# Patient Record
Sex: Female | Born: 1980 | Race: Black or African American | Hispanic: No | Marital: Single | Smoking: Never smoker
Health system: Southern US, Community
[De-identification: ages and names within clinical notes are randomized; demographics above are authoritative.]

## PROBLEM LIST (undated history)

## (undated) DIAGNOSIS — D649 Anemia, unspecified: Secondary | ICD-10-CM

## (undated) DIAGNOSIS — E0590 Thyrotoxicosis, unspecified without thyrotoxic crisis or storm: Secondary | ICD-10-CM

## (undated) DIAGNOSIS — E0500 Thyrotoxicosis with diffuse goiter without thyrotoxic crisis or storm: Secondary | ICD-10-CM

---

## 2004-01-05 ENCOUNTER — Emergency Department (HOSPITAL_COMMUNITY): Admission: EM | Admit: 2004-01-05 | Discharge: 2004-01-05 | Payer: Self-pay | Admitting: Family Medicine

## 2006-01-09 ENCOUNTER — Emergency Department: Payer: Self-pay | Admitting: Emergency Medicine

## 2006-06-01 ENCOUNTER — Emergency Department: Payer: Self-pay | Admitting: Emergency Medicine

## 2017-10-10 ENCOUNTER — Other Ambulatory Visit: Payer: Self-pay

## 2017-10-10 ENCOUNTER — Encounter
Admission: RE | Admit: 2017-10-10 | Discharge: 2017-10-10 | Disposition: A | Discharge status: Home / Self Care | Payer: Medicaid Other | Source: Ambulatory Visit | Attending: Obstetrics and Gynecology | Admitting: Obstetrics and Gynecology

## 2017-10-10 DIAGNOSIS — Z0183 Encounter for blood typing: Secondary | ICD-10-CM | POA: Insufficient documentation

## 2017-10-10 DIAGNOSIS — Z01812 Encounter for preprocedural laboratory examination: Secondary | ICD-10-CM | POA: Insufficient documentation

## 2017-10-10 HISTORY — DX: Thyrotoxicosis, unspecified without thyrotoxic crisis or storm: E05.90

## 2017-10-10 HISTORY — DX: Thyrotoxicosis with diffuse goiter without thyrotoxic crisis or storm: E05.00

## 2017-10-10 HISTORY — DX: Anemia, unspecified: D64.9

## 2017-10-10 LAB — CBC
HEMATOCRIT: 31.2 % — AB (ref 35.0–47.0)
Hemoglobin: 9.4 g/dL — ABNORMAL LOW (ref 12.0–16.0)
MCH: 18.4 pg — ABNORMAL LOW (ref 26.0–34.0)
MCHC: 30.2 g/dL — ABNORMAL LOW (ref 32.0–36.0)
MCV: 60.9 fL — ABNORMAL LOW (ref 80.0–100.0)
Platelets: 453 10*3/uL — ABNORMAL HIGH (ref 150–440)
RBC: 5.12 MIL/uL (ref 3.80–5.20)
RDW: 20.4 % — ABNORMAL HIGH (ref 11.5–14.5)
WBC: 3.3 10*3/uL — AB (ref 3.6–11.0)

## 2017-10-10 LAB — BASIC METABOLIC PANEL
Anion gap: 8 (ref 5–15)
BUN: 13 mg/dL (ref 6–20)
CALCIUM: 9 mg/dL (ref 8.9–10.3)
CO2: 25 mmol/L (ref 22–32)
Chloride: 106 mmol/L (ref 101–111)
Creatinine, Ser: 0.45 mg/dL (ref 0.44–1.00)
GFR calc non Af Amer: >60 mL/min (ref >60)
Glucose, Bld: 118 mg/dL — ABNORMAL HIGH (ref 65–99)
Potassium: 3.6 mmol/L (ref 3.5–5.1)
Sodium: 139 mmol/L (ref 135–145)

## 2017-10-10 LAB — TYPE AND SCREEN
ABO/RH(D): O POS
Antibody Screen: NEGATIVE

## 2017-10-10 NOTE — Patient Instructions (Signed)
Your procedure is scheduled on: Fri 10/18/17 Report to [place]. To find out your arrival time please call [phone] between 1PM - 3PM on Thur 10/17/17.  Remember: Instructions that are not followed completely may result in serious medical risk, up to and including death, or upon the discretion of your surgeon and anesthesiologist your surgery may need to be rescheduled.     _X__ 1. Do not eat food after midnight the night before your procedure.                 No gum chewing or hard candies. You may drink clear liquids up to 2 hours                 before you are scheduled to arrive for your surgery- DO not drink clear                 liquids within 2 hours of the start of your surgery.                 Clear Liquids include:  water, apple juice without pulp, clear carbohydrate                 drink such as Clearfast or Gatorade, Black Coffee or Tea (Do not add                 anything to coffee or tea).  __X__2.  On the morning of surgery brush your teeth with toothpaste and water, you                 may rinse your mouth with mouthwash if you wish.  Do not swallow any              toothpaste of mouthwash.     _X__ 3.  No Alcohol for 24 hours before or after surgery.   _X__ 4.  Do Not Smoke or use e-cigarettes For 24 Hours Prior to Your Surgery.                 Do not use any chewable tobacco products for at least 6 hours prior to                 surgery.  ____  5.  Bring all medications with you on the day of surgery if instructed.   __X__  6.  Notify your doctor if there is any change in your medical condition      (cold, fever, infections).     Do not wear jewelry, make-up, hairpins, clips or nail polish. Do not wear lotions, powders, or perfumes.  Do not shave 48 hours prior to surgery. Men may shave face and neck. Do not bring valuables to the hospital.    [facility] is not responsible for any belongings or  valuables.  Contacts, dentures/partials or body piercings may not be worn into surgery. Bring a case for your contacts, glasses or hearing aids, a denture cup will be supplied. Leave your suitcase in the car. After surgery it may be brought to your room. For patients admitted to the hospital, discharge time is determined by your treatment team.   Patients discharged the day of surgery will not be allowed to drive home.   Please read over the following fact sheets that you were given:   MRSA Information  __X__ Take these medicines the morning of surgery with A SIP OF WATER:  1. METHIMAZOLE  2. ZYRTEC IF NEEDED  3.   4.  5.  6.  ____ Fleet Enema (as directed)   __X__ Use CHG Soap/SAGE wipes as directed  ____ Use inhalers on the day of surgery  ____ Stop metformin/Janumet/Farxiga 2 days prior to surgery    ____ Take 1/2 of usual insulin dose the night before surgery. No insulin the morning          of surgery.   ____ Stop Blood Thinners Coumadin/Plavix/Xarelto/Pleta/Pradaxa/Eliquis/Effient/Aspirin  on   Or contact your Surgeon, Cardiologist or Medical Doctor regarding  ability to stop your blood thinners  __X__ Stop Anti-inflammatories 7 days before surgery such as Advil, Ibuprofen, Motrin,  BC or Goodies Powder, Naprosyn, Naproxen, Aleve, Aspirin    __X__ Stopall herbal supplements, fish oil or vitamin E until after surgery.    ____ Bring C-Pap to the hospital.

## 2017-10-10 NOTE — H&P (Signed)
Chelsea Adams is a 37 y.o. female presenting with Pre Op Consulting on 10/01/2017  HPI: Heavy, regular periods, last 2 weeks, getting worse. Has tried OCPs without relief. Is significantly negatively impacting quality of life.  Ultrasound in 8/18: Impression: 1. 10.4 cm heterogeneous fibroid comprising the majority of the uterine body and fundus. The endometrial complex is not visualized due to distortion by the fibroid. 2. The left ovary is not visualized due to bowel gas.  She has a hx of C/S x2 Hx of hyperthyroidism on methimazole.  She is requesting definitive management.  Pap smear: 2/19 neg EMBx: Cervical stenosis and patient anxiety/discomfort precluded EMBx  Past Medical History: has a past medical history of Gestational diabetes, Graves disease, Hypertension, Pap smear for cervical cancer screening, Proptosis (01/26/2014), and Ptosis of right eyelid (01/26/2014).  Past Surgical History: has a past surgical history that includes Cesarean section (01/09/2007) and cesarean delivery (Bilateral, 10/03/2012). Family History: family history includes Asthma in her father; High blood pressure (Hypertension) in her father and mother. Social History: reports that she has never smoked. She has never used smokeless tobacco. She reports that she does not drink alcohol or use drugs. OB/GYN History:  OB History  Gravida  2  Para  2  Term  0  Preterm  2  AB   Living  4   SAB   TAB   Ectopic   Molar   Multiple  1  Live Births  4      Allergies: has No Known Allergies. Medications:  Current Outpatient Medications:  . ALPRAZolam (XANAX) 0.5 MG tablet, Take 2 tablets prior to procedure and one tab after prn, Disp: 3 tablet, Rfl: 0 . ipratropium (ATROVENT) 0.06 % nasal spray, Place 2 sprays into both nostrils 3 (three) times daily., Disp: 15 mL, Rfl: 2 . levocetirizine (XYZAL) 5 MG tablet, Take 1 tablet (5 mg total) by mouth every evening., Disp: 30 tablet,  Rfl: 2 . levonorgestrel-ethinyl estradiol (LEVONORGESTREL-ETHINYL ESTRADIOL) 0.1-20 mg-mcg tablet, Take 1 tablet by mouth once daily., Disp: 3 Package, Rfl: 3 . methIMAzole (TAPAZOLE) 10 MG tablet, Take 2 tablets (20 mg total) by mouth once daily, Disp: 60 tablet, Rfl: 5  Review of Systems: No SOB, no palpitations or chest pain, no new lower extremity edema, no nausea or vomiting or bowel or bladder complaints. See HPI for gyn specific ROS.  Exam:   BP 120/80  Pulse 74  Ht 165.1 cm (5\' 5" )  Wt 85.7 kg (189 lb)  BMI 31.45 kg/m   General: Patient is well-groomed, well-nourished, appears stated age in no acute distress  HEENT: head is atraumatic and normocephalic, trachea is midline, neck is supple with no palpable nodules  CV: Regular rhythm and normal heart rate, no murmur  Pulm: Clear to auscultation throughout lung fields with no wheezing, crackles, or rhonchi. No increased work of breathing  Abdomen: soft , no mass, non-tender, no rebound tenderness, no hepatomegaly  Pelvic:  External genitalia: vulva and labia without lesions, Tanner stage 5 Urethra: no prolapse Vagina: normal physiologic d/c Cervix: no lesions, no cervical motion tenderness  Uterus: normal size shape and contour, non-tender Adnexa: no mass, non-tender  Rectovaginal: external exam normal Cervical stenosis and patient anxiety/discomfort precluded EMBx  Impression:   The primary encounter diagnosis was Excessive or frequent menstruation. A diagnosis of Intramural, submucous, and subserous leiomyoma of uterus was also pertinent to this visit.  Plan:   Patient returns for a preoperative discussion regarding her plans to proceed with surgical treatment  of her by total laparoscopic hysterectomy with bilateral salpingectomy procedure. We will perform a cystoscopy to evaluate the urinary tract after the procedure. I have given her a 30% chance of needing a TAH with hx of C/S x2 and large fibroid.  Despite no  tissue with EMBx dx will proceed with surgery, as young woman unlikely bleeding due to hyperplasia with BMI of 31 and known large fibroids. She is aware that if we find malignancy on pathology, she may need a second surgery or other interventions.  I offered cervical block and po pain meds, as well as anxiolytics.  The patient and I discussed the technical aspects of the procedure including the potential for risks and complications. These include but are not limited to the risk of infection requiring post-operative antibiotics or further procedures. We talked about the risk of injury to adjacent organs including bladder, bowel, ureter, blood vessels or nerves. We talked about the need to convert to an open incision. We talked about the possible need for blood transfusion. We talked about postop complications such as thromboembolic or cardiopulmonary complications. All of her questions were answered. Her preoperative exam was completed and the appropriate consents were signed. She is scheduled to undergo this procedure in the near future.  Specific Peri-operative Considerations:  - Consent: obtained today - Health Maintenance: up to date - Labs: CBC, CMP preoperatively - Studies: EKG, CXR preoperatively - Bowel Preparation: None required - Abx: Cefoxitin 2g - VTE ppx: SCDs perioperatively - Glucose Protocol: n/a - Beta-blockade: n/a

## 2017-10-17 MED ORDER — CEFAZOLIN SODIUM-DEXTROSE 2-4 GM/100ML-% IV SOLN
2.0000 g | INTRAVENOUS | Status: AC
  Administered 2017-10-18: 2 g via INTRAVENOUS

## 2017-10-18 ENCOUNTER — Other Ambulatory Visit: Payer: Self-pay

## 2017-10-18 ENCOUNTER — Ambulatory Visit: Payer: Medicaid Other | Admitting: Anesthesiology

## 2017-10-18 ENCOUNTER — Encounter
Admission: RE | Disposition: A | Discharge status: Home / Self Care | Payer: Self-pay | Source: Ambulatory Visit | Attending: Obstetrics and Gynecology

## 2017-10-18 ENCOUNTER — Ambulatory Visit
Admission: RE | Admit: 2017-10-18 | Discharge: 2017-10-18 | Disposition: A | Discharge status: Home / Self Care | Payer: Medicaid Other | Source: Ambulatory Visit | Attending: Obstetrics and Gynecology | Admitting: Obstetrics and Gynecology

## 2017-10-18 DIAGNOSIS — K660 Peritoneal adhesions (postprocedural) (postinfection): Secondary | ICD-10-CM | POA: Diagnosis not present

## 2017-10-18 DIAGNOSIS — N888 Other specified noninflammatory disorders of cervix uteri: Secondary | ICD-10-CM | POA: Insufficient documentation

## 2017-10-18 DIAGNOSIS — D259 Leiomyoma of uterus, unspecified: Secondary | ICD-10-CM | POA: Diagnosis not present

## 2017-10-18 DIAGNOSIS — E0500 Thyrotoxicosis with diffuse goiter without thyrotoxic crisis or storm: Secondary | ICD-10-CM | POA: Insufficient documentation

## 2017-10-18 DIAGNOSIS — Z79899 Other long term (current) drug therapy: Secondary | ICD-10-CM | POA: Insufficient documentation

## 2017-10-18 DIAGNOSIS — I10 Essential (primary) hypertension: Secondary | ICD-10-CM | POA: Diagnosis not present

## 2017-10-18 DIAGNOSIS — N72 Inflammatory disease of cervix uteri: Secondary | ICD-10-CM | POA: Insufficient documentation

## 2017-10-18 DIAGNOSIS — N939 Abnormal uterine and vaginal bleeding, unspecified: Secondary | ICD-10-CM | POA: Diagnosis present

## 2017-10-18 HISTORY — PX: CYSTOSCOPY: SHX5120

## 2017-10-18 HISTORY — PX: LAPAROSCOPIC HYSTERECTOMY: SHX1926

## 2017-10-18 HISTORY — PX: LAPAROSCOPIC BILATERAL SALPINGECTOMY: SHX5889

## 2017-10-18 LAB — CBC
HEMATOCRIT: 26.1 % — AB (ref 35.0–47.0)
Hemoglobin: 7.9 g/dL — ABNORMAL LOW (ref 12.0–16.0)
MCH: 18.2 pg — ABNORMAL LOW (ref 26.0–34.0)
MCHC: 30.1 g/dL — ABNORMAL LOW (ref 32.0–36.0)
MCV: 60.5 fL — AB (ref 80.0–100.0)
Platelets: 410 10*3/uL (ref 150–440)
RBC: 4.31 MIL/uL (ref 3.80–5.20)
RDW: 19.9 % — ABNORMAL HIGH (ref 11.5–14.5)
WBC: 3.2 10*3/uL — AB (ref 3.6–11.0)

## 2017-10-18 LAB — ABO/RH: ABO/RH(D): O POS

## 2017-10-18 LAB — POCT PREGNANCY, URINE: PREG TEST UR: NEGATIVE

## 2017-10-18 SURGERY — HYSTERECTOMY, TOTAL, LAPAROSCOPIC
Anesthesia: General | Wound class: Clean Contaminated

## 2017-10-18 MED ORDER — FENTANYL CITRATE (PF) 100 MCG/2ML IJ SOLN
INTRAMUSCULAR | Status: AC
  Filled 2017-10-18: qty 2

## 2017-10-18 MED ORDER — ONDANSETRON HCL 4 MG/2ML IJ SOLN
INTRAMUSCULAR | Status: DC | PRN
  Administered 2017-10-18: 4 mg via INTRAVENOUS

## 2017-10-18 MED ORDER — KETOROLAC TROMETHAMINE 30 MG/ML IJ SOLN
INTRAMUSCULAR | Status: AC
  Filled 2017-10-18: qty 1

## 2017-10-18 MED ORDER — OXYCODONE HCL 5 MG PO TABS
ORAL_TABLET | ORAL | Status: AC
  Filled 2017-10-18: qty 1

## 2017-10-18 MED ORDER — SUCCINYLCHOLINE CHLORIDE 20 MG/ML IJ SOLN
INTRAMUSCULAR | Status: AC
  Filled 2017-10-18: qty 1

## 2017-10-18 MED ORDER — PROMETHAZINE HCL 25 MG/ML IJ SOLN: 6.2500 mg | INTRAMUSCULAR | Status: DC | PRN

## 2017-10-18 MED ORDER — ACETAMINOPHEN 500 MG PO TABS
ORAL_TABLET | ORAL | Status: AC
  Administered 2017-10-18: 1000 mg via ORAL
  Filled 2017-10-18: qty 2

## 2017-10-18 MED ORDER — CEFAZOLIN SODIUM-DEXTROSE 2-4 GM/100ML-% IV SOLN
INTRAVENOUS | Status: AC
  Filled 2017-10-18: qty 100

## 2017-10-18 MED ORDER — LACTATED RINGERS IV SOLN: INTRAVENOUS | Status: DC

## 2017-10-18 MED ORDER — KETOROLAC TROMETHAMINE 30 MG/ML IJ SOLN
INTRAMUSCULAR | Status: DC | PRN
  Administered 2017-10-18: 30 mg via INTRAVENOUS

## 2017-10-18 MED ORDER — METHYLENE BLUE 1 % INJ SOLN
INTRAMUSCULAR | Status: AC
  Filled 2017-10-18: qty 10

## 2017-10-18 MED ORDER — ROCURONIUM BROMIDE 50 MG/5ML IV SOLN
INTRAVENOUS | Status: AC
  Filled 2017-10-18: qty 1

## 2017-10-18 MED ORDER — OXYCODONE HCL 5 MG/5ML PO SOLN: 5.0000 mg | Freq: Once | ORAL | Status: AC | PRN

## 2017-10-18 MED ORDER — FAMOTIDINE 20 MG PO TABS
20.0000 mg | ORAL_TABLET | Freq: Once | ORAL | Status: AC
  Administered 2017-10-18: 20 mg via ORAL

## 2017-10-18 MED ORDER — DEXAMETHASONE SODIUM PHOSPHATE 10 MG/ML IJ SOLN
INTRAMUSCULAR | Status: DC | PRN
  Administered 2017-10-18: 10 mg via INTRAVENOUS

## 2017-10-18 MED ORDER — LACTATED RINGERS IV SOLN
INTRAVENOUS | Status: DC
  Administered 2017-10-18 (×4): via INTRAVENOUS

## 2017-10-18 MED ORDER — SUGAMMADEX SODIUM 500 MG/5ML IV SOLN
INTRAVENOUS | Status: DC | PRN
  Administered 2017-10-18: 173.8 mg via INTRAVENOUS

## 2017-10-18 MED ORDER — SEVOFLURANE IN SOLN
RESPIRATORY_TRACT | Status: AC
Start: 2017-10-18 — End: ?
  Filled 2017-10-18: qty 250

## 2017-10-18 MED ORDER — FAMOTIDINE 20 MG PO TABS
ORAL_TABLET | ORAL | Status: AC
  Administered 2017-10-18: 20 mg via ORAL
  Filled 2017-10-18: qty 1

## 2017-10-18 MED ORDER — DEXAMETHASONE SODIUM PHOSPHATE 10 MG/ML IJ SOLN
INTRAMUSCULAR | Status: AC
Start: 2017-10-18 — End: ?
  Filled 2017-10-18: qty 1

## 2017-10-18 MED ORDER — ACETAMINOPHEN 10 MG/ML IV SOLN
INTRAVENOUS | Status: DC | PRN
  Administered 2017-10-18: 1000 mg via INTRAVENOUS

## 2017-10-18 MED ORDER — FENTANYL CITRATE (PF) 100 MCG/2ML IJ SOLN
25.0000 ug | INTRAMUSCULAR | Status: DC | PRN
  Administered 2017-10-18: 50 ug via INTRAVENOUS

## 2017-10-18 MED ORDER — LIDOCAINE HCL (PF) 2 % IJ SOLN
INTRAMUSCULAR | Status: AC
  Filled 2017-10-18: qty 10

## 2017-10-18 MED ORDER — SUGAMMADEX SODIUM 200 MG/2ML IV SOLN
INTRAVENOUS | Status: AC
Start: 2017-10-18 — End: ?
  Filled 2017-10-18: qty 2

## 2017-10-18 MED ORDER — ONDANSETRON HCL 4 MG/2ML IJ SOLN
INTRAMUSCULAR | Status: AC
  Filled 2017-10-18: qty 2

## 2017-10-18 MED ORDER — BUPIVACAINE HCL 0.5 % IJ SOLN
INTRAMUSCULAR | Status: DC | PRN
  Administered 2017-10-18: 15 mL

## 2017-10-18 MED ORDER — MIDAZOLAM HCL 2 MG/2ML IJ SOLN
INTRAMUSCULAR | Status: AC
  Filled 2017-10-18: qty 2

## 2017-10-18 MED ORDER — GABAPENTIN 300 MG PO CAPS
ORAL_CAPSULE | ORAL | Status: AC
  Administered 2017-10-18: 900 mg via ORAL
  Filled 2017-10-18: qty 3

## 2017-10-18 MED ORDER — EPHEDRINE SULFATE 50 MG/ML IJ SOLN
INTRAMUSCULAR | Status: AC
  Filled 2017-10-18: qty 1

## 2017-10-18 MED ORDER — OXYCODONE HCL 5 MG PO CAPS: 5.0000 mg | ORAL_CAPSULE | Freq: Four times a day (QID) | ORAL | 0 refills | Status: AC | PRN

## 2017-10-18 MED ORDER — MEPERIDINE HCL 50 MG/ML IJ SOLN: 6.2500 mg | INTRAMUSCULAR | Status: DC | PRN

## 2017-10-18 MED ORDER — DOCUSATE SODIUM 100 MG PO CAPS: 100.0000 mg | ORAL_CAPSULE | Freq: Two times a day (BID) | ORAL | 0 refills | Status: AC

## 2017-10-18 MED ORDER — IBUPROFEN 800 MG PO TABS: 800.0000 mg | ORAL_TABLET | Freq: Three times a day (TID) | ORAL | 1 refills | Status: AC | PRN

## 2017-10-18 MED ORDER — ROCURONIUM BROMIDE 100 MG/10ML IV SOLN
INTRAVENOUS | Status: DC | PRN
  Administered 2017-10-18: 10 mg via INTRAVENOUS
  Administered 2017-10-18: 25 mg via INTRAVENOUS
  Administered 2017-10-18: 40 mg via INTRAVENOUS

## 2017-10-18 MED ORDER — GABAPENTIN 800 MG PO TABS: 800.0000 mg | ORAL_TABLET | Freq: Every day | ORAL | 0 refills | Status: AC

## 2017-10-18 MED ORDER — ACETAMINOPHEN 500 MG PO TABS: 1000.0000 mg | ORAL_TABLET | Freq: Four times a day (QID) | ORAL | 0 refills | Status: AC

## 2017-10-18 MED ORDER — MIDAZOLAM HCL 2 MG/2ML IJ SOLN
INTRAMUSCULAR | Status: DC | PRN
  Administered 2017-10-18: 2 mg via INTRAVENOUS

## 2017-10-18 MED ORDER — OXYCODONE HCL 5 MG PO TABS
5.0000 mg | ORAL_TABLET | Freq: Once | ORAL | Status: AC | PRN
  Administered 2017-10-18: 5 mg via ORAL

## 2017-10-18 MED ORDER — PHENYLEPHRINE HCL 10 MG/ML IJ SOLN
INTRAMUSCULAR | Status: AC
  Filled 2017-10-18: qty 1

## 2017-10-18 MED ORDER — LIDOCAINE HCL (CARDIAC) 20 MG/ML IV SOLN
INTRAVENOUS | Status: DC | PRN
  Administered 2017-10-18: 40 mg via INTRAVENOUS

## 2017-10-18 MED ORDER — ACETAMINOPHEN 10 MG/ML IV SOLN
INTRAVENOUS | Status: AC
Start: 2017-10-18 — End: ?
  Filled 2017-10-18: qty 100

## 2017-10-18 MED ORDER — BUPIVACAINE HCL (PF) 0.5 % IJ SOLN
INTRAMUSCULAR | Status: AC
Start: 2017-10-18 — End: ?
  Filled 2017-10-18: qty 30

## 2017-10-18 MED ORDER — PROPOFOL 10 MG/ML IV BOLUS
INTRAVENOUS | Status: DC | PRN
  Administered 2017-10-18: 140 mg via INTRAVENOUS

## 2017-10-18 MED ORDER — FENTANYL CITRATE (PF) 100 MCG/2ML IJ SOLN
INTRAMUSCULAR | Status: DC | PRN
  Administered 2017-10-18 (×4): 50 ug via INTRAVENOUS

## 2017-10-18 MED ORDER — ACETAMINOPHEN 500 MG PO TABS
1000.0000 mg | ORAL_TABLET | ORAL | Status: AC
  Administered 2017-10-18: 1000 mg via ORAL

## 2017-10-18 MED ORDER — GABAPENTIN 300 MG PO CAPS
900.0000 mg | ORAL_CAPSULE | ORAL | Status: AC
Start: 2017-10-18 — End: 2017-10-18
  Administered 2017-10-18: 900 mg via ORAL

## 2017-10-18 SURGICAL SUPPLY — 64 items
ADH SKN CLS APL DERMABOND .7 (GAUZE/BANDAGES/DRESSINGS) ×2
BAG SPEC RTRVL LRG 6X4 10 (ENDOMECHANICALS)
BAG URINE DRAINAGE (UROLOGICAL SUPPLIES) ×6 IMPLANT
BLADE SURG SZ11 CARB STEEL (BLADE) ×4 IMPLANT
CATH FOLEY 2WAY  5CC 16FR (CATHETERS) ×2
CATH FOLEY 2WAY 5CC 16FR (CATHETERS) ×2
CATH ROBINSON RED A/P 16FR (CATHETERS) ×4 IMPLANT
CATH URTH 16FR FL 2W BLN LF (CATHETERS) ×2 IMPLANT
CHLORAPREP W/TINT 26ML (MISCELLANEOUS) ×4 IMPLANT
CLOSURE WOUND 1/4X4 (GAUZE/BANDAGES/DRESSINGS) ×1
CORD MONOPOLAR M/FML 12FT (MISCELLANEOUS) ×4 IMPLANT
COUNTER NEEDLE 20/40 LG (NEEDLE) ×4 IMPLANT
COVER LIGHT HANDLE STERIS (MISCELLANEOUS) ×8 IMPLANT
DERMABOND ADVANCED (GAUZE/BANDAGES/DRESSINGS) ×2
DERMABOND ADVANCED .7 DNX12 (GAUZE/BANDAGES/DRESSINGS) ×2 IMPLANT
DEVICE SUTURE ENDOST 10MM (ENDOMECHANICALS) ×2 IMPLANT
DRAPE STERI POUCH LG 24X46 STR (DRAPES) ×4 IMPLANT
DRSG TEGADERM 2-3/8X2-3/4 SM (GAUZE/BANDAGES/DRESSINGS) ×12 IMPLANT
GLOVE BIO SURGEON STRL SZ7 (GLOVE) ×12 IMPLANT
GLOVE INDICATOR 7.5 STRL GRN (GLOVE) ×4 IMPLANT
GOWN STRL REUS W/ TWL LRG LVL3 (GOWN DISPOSABLE) ×4 IMPLANT
GOWN STRL REUS W/ TWL XL LVL3 (GOWN DISPOSABLE) ×2 IMPLANT
GOWN STRL REUS W/TWL LRG LVL3 (GOWN DISPOSABLE) ×8
GOWN STRL REUS W/TWL XL LVL3 (GOWN DISPOSABLE) ×4
IRRIGATION STRYKERFLOW (MISCELLANEOUS) ×2 IMPLANT
IRRIGATOR STRYKERFLOW (MISCELLANEOUS) ×4
IV LACTATED RINGERS 1000ML (IV SOLUTION) ×4 IMPLANT
IV NS 1000ML (IV SOLUTION) ×4
IV NS 1000ML BAXH (IV SOLUTION) ×2 IMPLANT
KIT PINK PAD W/HEAD ARE REST (MISCELLANEOUS) ×4
KIT PINK PAD W/HEAD ARM REST (MISCELLANEOUS) ×2 IMPLANT
KIT TURNOVER CYSTO (KITS) ×4 IMPLANT
LABEL OR SOLS (LABEL) ×4 IMPLANT
LIGASURE VESSEL 5MM BLUNT TIP (ELECTROSURGICAL) ×2 IMPLANT
MANIPULATOR VCARE LG CRV RETR (MISCELLANEOUS) IMPLANT
MANIPULATOR VCARE SML CRV RETR (MISCELLANEOUS) ×2 IMPLANT
MANIPULATOR VCARE STD CRV RETR (MISCELLANEOUS) IMPLANT
NS IRRIG 500ML POUR BTL (IV SOLUTION) ×4 IMPLANT
OCCLUDER COLPOPNEUMO (BALLOONS) ×4 IMPLANT
PACK GYN LAPAROSCOPIC (MISCELLANEOUS) ×4 IMPLANT
PAD OB MATERNITY 4.3X12.25 (PERSONAL CARE ITEMS) ×4 IMPLANT
PAD PREP 24X41 OB/GYN DISP (PERSONAL CARE ITEMS) ×4 IMPLANT
POUCH SPECIMEN RETRIEVAL 10MM (ENDOMECHANICALS) IMPLANT
SCISSORS METZENBAUM CVD 33 (INSTRUMENTS) ×2 IMPLANT
SET CYSTO W/LG BORE CLAMP LF (SET/KITS/TRAYS/PACK) ×4 IMPLANT
SLEEVE ENDOPATH XCEL 5M (ENDOMECHANICALS) ×6 IMPLANT
SPONGE GAUZE 2X2 8PLY STER LF (GAUZE/BANDAGES/DRESSINGS) ×1
SPONGE GAUZE 2X2 8PLY STRL LF (GAUZE/BANDAGES/DRESSINGS) ×5 IMPLANT
STRIP CLOSURE SKIN 1/4X4 (GAUZE/BANDAGES/DRESSINGS) ×3 IMPLANT
SURGILUBE 2OZ TUBE FLIPTOP (MISCELLANEOUS) ×4 IMPLANT
SUT ENDO VLOC 180-0-8IN (SUTURE) ×2 IMPLANT
SUT MNCRL 4-0 (SUTURE) ×4
SUT MNCRL 4-0 27XMFL (SUTURE) ×2
SUT MNCRL AB 4-0 PS2 18 (SUTURE) ×4 IMPLANT
SUT VIC AB 0 CT1 36 (SUTURE) ×4 IMPLANT
SUT VIC AB 2-0 UR6 27 (SUTURE) ×4 IMPLANT
SUT VIC AB 4-0 SH 27 (SUTURE) ×4
SUT VIC AB 4-0 SH 27XANBCTRL (SUTURE) ×2 IMPLANT
SUTURE MNCRL 4-0 27XMF (SUTURE) ×2 IMPLANT
SYR 10ML LL (SYRINGE) ×4 IMPLANT
SYR 50ML LL SCALE MARK (SYRINGE) ×4 IMPLANT
TROCAR XCEL NON-BLD 5MMX100MML (ENDOMECHANICALS) ×8 IMPLANT
TUBING INSUF HEATED (TUBING) ×4 IMPLANT
TUBING INSUFFLATION (TUBING) ×4 IMPLANT

## 2017-10-18 NOTE — Anesthesia Preprocedure Evaluation (Signed)
Anesthesia Evaluation  Patient identified by MRN, date of birth, ID band Patient awake    Reviewed: Allergy & Precautions, NPO status , Patient's Chart, lab work & pertinent test results  History of Anesthesia Complications Negative for: history of anesthetic complications  Airway Mallampati: III  TM Distance: >3 FB Neck ROM: Full    Dental no notable dental hx.    Pulmonary neg pulmonary ROS, neg sleep apnea, neg COPD,    breath sounds clear to auscultation- rhonchi (-) wheezing      Cardiovascular Exercise Tolerance: Good (-) hypertension(-) CAD and (-) Past MI  Rhythm:Regular Rate:Normal - Systolic murmurs and - Diastolic murmurs    Neuro/Psych negative neurological ROS  negative psych ROS   GI/Hepatic negative GI ROS, Neg liver ROS,   Endo/Other  negative endocrine ROSneg diabetesHyperthyroidism   Renal/GU negative Renal ROS     Musculoskeletal negative musculoskeletal ROS (+)   Abdominal (+) + obese,   Peds  Hematology  (+) anemia ,   Anesthesia Other Findings Past Medical History: No date: Anemia No date: Graves disease No date: Hyperthyroidism   Reproductive/Obstetrics                             Anesthesia Physical Anesthesia Plan  ASA: II  Anesthesia Plan: General   Post-op Pain Management:    Induction: Intravenous  PONV Risk Score and Plan: 2 and Ondansetron, Dexamethasone and Midazolam  Airway Management Planned: Oral ETT  Additional Equipment:   Intra-op Plan:   Post-operative Plan: Extubation in OR  Informed Consent: I have reviewed the patients History and Physical, chart, labs and discussed the procedure including the risks, benefits and alternatives for the proposed anesthesia with the patient or authorized representative who has indicated his/her understanding and acceptance.   Dental advisory given  Plan Discussed with: CRNA and  Anesthesiologist  Anesthesia Plan Comments:         Anesthesia Quick Evaluation

## 2017-10-18 NOTE — Anesthesia Post-op Follow-up Note (Signed)
Anesthesia QCDR form completed.        

## 2017-10-18 NOTE — Interval H&P Note (Signed)
History and Physical Interval Note:  10/18/2017 7:11 AM  Chelsea Adams  has presented today for surgery, with the diagnosis of AUB, Fibroids  The various methods of treatment have been discussed with the patient and family. After consideration of risks, benefits and other options for treatment, the patient has consented to  Procedure(s): HYSTERECTOMY TOTAL LAPAROSCOPIC (N/A) LAPAROSCOPIC BILATERAL SALPINGECTOMY (Bilateral) CYSTOSCOPY (N/A) as a surgical intervention .  The patient's history has been reviewed, patient examined, no change in status, stable for surgery.  I have reviewed the patient's chart and labs.  Questions were answered to the patient's satisfaction.     Benjaman Kindler

## 2017-10-18 NOTE — Discharge Instructions (Signed)
Discharge instructions after   total laparoscopic hysterectomy   For the next three days, take ibuprofen and acetaminophen on a schedule, every 8 hours. You can take them together or you can intersperse them, and take one every four hours. I also gave you gabapentin for nighttime, to help you sleep and also to control pain. Take gabapentin medicines at night for at least the next 3 nights. You also have a narcotic, oxycodone, to take as needed if the above medicines don't help.  Postop constipation is a major cause of pain. Stay well hydrated, walk as you tolerate, and take over the counter senna as well as stool softeners if you need them.    Signs and Symptoms to Report Call our office at [phone] if you have any of the following.  . Fever over 100.4 degrees or higher . Severe stomach pain not relieved with pain medications . Bright red bleeding that's heavier than a period that does not slow with rest . To go the bathroom a lot (frequency), you can't hold your urine (urgency), or it hurts when you empty your bladder (urinate) . Chest pain . Shortness of breath . Pain in the calves of your legs . Severe nausea and vomiting not relieved with anti-nausea medications . Signs of infection around your wounds, such as redness, hot to touch, swelling, green/yellow drainage (like pus), bad smelling discharge . Any concerns  What You Can Expect after Surgery . You may see some pink tinged, bloody fluid and bruising around the wound. This is normal. . You may notice shoulder and neck pain. This is caused by the gas used during surgery to expand your abdomen so your surgeon could get to the uterus easier. . You may have a sore throat because of the tube in your mouth during general anesthesia. This will go away in 2 to 3 days. . You may have some stomach cramps. . You may notice spotting on your panties. . You may have pain around the incision sites.   Activities after Your  Discharge Follow these guidelines to help speed your recovery at home: . Do the coughing and deep breathing as you did in the hospital for 2 weeks. Use the small blue breathing device, called the incentive spirometer for 2 weeks. . Don't drive if you are in pain or taking narcotic pain medicine. You may drive when you can safely slam on the brakes, turn the wheel forcefully, and rotate your torso comfortably. This is typically 1-2 weeks. Practice in a parking lot or side street prior to attempting to drive regularly.  . Ask others to help with household chores for 4 weeks. . Do not lift anything heavier that 10 pounds for 4-6 weeks. This includes pets, children, and groceries. . Don't do strenuous activities, exercises, or sports like vacuuming, tennis, squash, etc. until your doctor says it is safe to do so. ---Maintain pelvic rest for 8 weeks. This means nothing in the vagina or rectum at all (no douching, tampons, intercourse) for 8 weeks.  . Walk as you feel able. Rest often since it may take two or three weeks for your energy level to return to normal.  . You may climb stairs . Avoid constipation:   -Eat fruits, vegetables, and whole grains. Eat small meals as your appetite will take time to return to normal.   -Drink 6 to 8 glasses of water each day unless your doctor has told you to limit your fluids.   -Use a laxative   or stool softener as needed if constipation becomes a problem. You may take Miralax, metamucil, Citrucil, Colace, Senekot, FiberCon, etc. If this does not relieve the constipation, try two tablespoons of Milk Of Magnesia every 8 hours until your bowels move.   You may shower. Gently wash the wounds with a mild soap and water. Pat dry.  Do not get in a hot tub, swimming pool, etc. for 6 weeks.  Do not use lotions, oils, powders on the wounds.  Do not douche, use tampons, or have sex until your doctor says it is okay.  Take your pain medicine when you need it. The medicine  may not work as well if the pain is bad.  Take the medicines you were taking before surgery. Other medications you will need are pain medications (around the clock for 3 days) and possibly constipation and nausea medications.   AMBULATORY SURGERY  DISCHARGE INSTRUCTIONS   1) The drugs that you were given will stay in your system until tomorrow so for the next 24 hours you should not:  A) Drive an automobile B) Make any legal decisions C) Drink any alcoholic beverage   2) You may resume regular meals tomorrow.  Today it is better to start with liquids and gradually work up to solid foods.  You may eat anything you prefer, but it is better to start with liquids, then soup and crackers, and gradually work up to solid foods.   3) Please notify your doctor immediately if you have any unusual bleeding, trouble breathing, redness and pain at the surgery site, drainage, fever, or pain not relieved by medication.    4) Additional Instructions:        Please contact your physician with any problems or Same Day Surgery at [phone], Monday through Friday 6 am to 4 pm, or [place] at [facility] number at [phone].

## 2017-10-18 NOTE — Anesthesia Procedure Notes (Signed)
Procedure Name: Intubation Date/Time: 10/18/2017 10:00 AM Performed by: Allean Found, CRNA Pre-anesthesia Checklist: Patient identified, Emergency Drugs available, Suction available, Patient being monitored and Timeout performed Patient Re-evaluated:Patient Re-evaluated prior to induction Oxygen Delivery Method: Circle system utilized Preoxygenation: Pre-oxygenation with 100% oxygen Induction Type: IV induction Ventilation: Mask ventilation without difficulty Laryngoscope Size: Mac and 3 Grade View: Grade I Tube type: Oral Tube size: 7.0 mm Number of attempts: 1 Airway Equipment and Method: Stylet Secured at: 21 cm Tube secured with: Tape Dental Injury: Teeth and Oropharynx as per pre-operative assessment

## 2017-10-18 NOTE — Anesthesia Postprocedure Evaluation (Signed)
Anesthesia Post Note  Patient: Chelsea Adams  Procedure(s) Performed: HYSTERECTOMY TOTAL LAPAROSCOPIC (N/A ) LAPAROSCOPIC BILATERAL SALPINGECTOMY (Bilateral ) CYSTOSCOPY (N/A )  Patient location during evaluation: PACU Anesthesia Type: General Level of consciousness: awake and alert Pain management: pain level controlled Vital Signs Assessment: post-procedure vital signs reviewed and stable Respiratory status: spontaneous breathing, nonlabored ventilation and respiratory function stable Cardiovascular status: blood pressure returned to baseline and stable Postop Assessment: no apparent nausea or vomiting Anesthetic complications: no     Last Vitals:  Vitals:   10/18/17 1353 10/18/17 1408  BP: 131/87 (!) 131/95  Pulse: 95 99  Resp: 20 (!) 24  Temp:    SpO2: 100% 100%    Last Pain:  Vitals:   10/18/17 1408  TempSrc:   PainSc: 8                  Nova Schmuhl Harvie Heck

## 2017-10-18 NOTE — Op Note (Signed)
Curlene Labrum PROCEDURE DATE: 10/18/2017  PREOPERATIVE DIAGNOSIS: AUB-F, 20wk sized uterus, failed medical management, 2 prior C-sections  POSTOPERATIVE DIAGNOSIS: The same PROCEDURE: Lysis of adhesions,Total laparoscopic hysterectomy, bilateral salpingectomy, cystoscopy-  with vaginal morcellation SURGEON:  Dr. Benjaman Kindler ASSISTANT: Dr. Vikki Ports Ward Anesthesiologist:  Anesthesiologist: Alphonsus Sias, MD; Emmie Niemann, MD CRNA: Demetrius Charity, CRNA; [place], Levie Heritage, CRNA; Allean Found, CRNA  INDICATIONS: 37 y.o. F  here for definitive surgical management secondary to the indications listed under preoperative diagnoses; please see preoperative note for further details.  Risks of surgery were discussed with the patient including but not limited to: bleeding which may require transfusion or reoperation; infection which may require antibiotics; injury to bowel, bladder, ureters or other surrounding organs; need for additional procedures; thromboembolic phenomenon, incisional problems and other postoperative/anesthesia complications. Written informed consent was obtained.    FINDINGS:  Enlarged uterus measuring 20cm by sound, minimal adhesions between bladder and uterus, redundant sigmoid colon and +umbilical omental adhesions  ANESTHESIA:    General INTRAVENOUS FLUIDS:1200  ml ESTIMATED BLOOD LOSS:300 ml URINE OUTPUT: 600 ml   SPECIMENS: Uterus, cervix, bilateral fallopian tubes in pieces COMPLICATIONS: None immediate  PROCEDURE IN DETAIL:  The patient received prophalactic intravenous antibiotics and had sequential compression devices applied to her lower extremities while in the preoperative area.  She was then taken to the operating room where general anesthesia was administered and was found to be adequate.  She was placed in the dorsal lithotomy position, and was prepped and draped in a sterile manner.  A formal time out was performed with all team members present and  in agreement.  A V-care uterine manipulator - small- was placed at this time.  A Foley catheter was inserted into her bladder and attached to constant drainage. Attention was turned to the abdomen and 0.5% Marcaine infused subq. A 62mm umbilical incision was made with the scalpel.  The Optiview 5-mm trocar and sleeve were then advanced without difficulty with the laparoscope under direct visualization into the abdomen.  The abdomen was then insufflated with carbon dioxide gas and adequate pneumoperitoneum was obtained.  A survey of the patient's pelvis and abdomen revealed the findings above, with omentum that was taken down with Ligasure at the umbilicus.  Bilateral lower quadrant ports (5 mm on the right and 5 mm on the left) were then placed under direct visualization. These were higher than normal, due to the size of the uterus.  The pelvis was then carefully examined.  Attention was turned to the fallopian tubes; these were freed from the underlying mesosalpinx and the uterine attachments using the Ligasure device.  The bilateral round and broad ligaments were then clamped and transected with the Ligasure device.  The uterine artery was then skeletonized and a bladder flap was created.  The ureters were noted to be safely away from the area of dissection.  The bladder was then bluntly dissected off the lower uterine segment.    At this point, attention was turned to the uterine vessels, which were clamped and cauterized using the fulguration Kleppenger and Ligasure on the left, and then the right. After the uterine blood flow at the level of the internal os was controlled, both arteries were cut with the Ligasure.  Good hemostasis was noted overall.  The uterosacral and cardinal ligaments were clamped, cut and ligated bilaterally .  Attention was then turned to the cervicovaginal junction, and monopolar scissors were used to transect the cervix from the surrounding vagina using the  ring of the V-care as a  guide. This was done circumferentially allowing total hysterectomy.  The uterus was then removed through the vagina with the coring out methods and delivering the posterior side, and the vaginal cuff incision was then closed with running 0-Vicryl suture.  Overall excellent hemostasis was noted.    Attention was returned to the abdomen.The ureters were reexamined bilaterally and were pulsating normally. The abdominal pressure was reduced and hemostasis was confirmed.   Cystoscopy showed bilateral ureteral jets.  No stitches were visualized in the bladder during cystoscopy.  All trocars were removed under direct visualization, and the abdomen was desufflated.  All skin incisions were closed with 4-0 Vicryl subcuticular stitches and Dermabond. The patient tolerated the procedures well.  All instruments, needles, and sponge counts were correct x 2. The patient was taken to the recovery room awake, extubated and in stable condition.

## 2017-10-18 NOTE — Transfer of Care (Signed)
Immediate Anesthesia Transfer of Care Note  Patient: Chelsea Adams  Procedure(s) Performed: HYSTERECTOMY TOTAL LAPAROSCOPIC (N/A ) LAPAROSCOPIC BILATERAL SALPINGECTOMY (Bilateral ) CYSTOSCOPY (N/A )  Patient Location: PACU  Anesthesia Type:General  Level of Consciousness: awake and drowsy  Airway & Oxygen Therapy: Patient Spontanous Breathing  Post-op Assessment: Report given to RN  Post vital signs: stable  Last Vitals:  Vitals Value Taken Time  BP 122/79 10/18/2017  1:08 PM  Temp    Pulse 95 10/18/2017  1:11 PM  Resp 20 10/18/2017  1:11 PM  SpO2 100 % 10/18/2017  1:11 PM  Vitals shown include unvalidated device data.  Last Pain:  Vitals:   10/18/17 1308  TempSrc:   PainSc: (P) Asleep         Complications: No apparent anesthesia complications

## 2017-10-21 LAB — SURGICAL PATHOLOGY

## 2019-01-14 ENCOUNTER — Other Ambulatory Visit: Payer: Self-pay | Admitting: Internal Medicine

## 2019-01-14 DIAGNOSIS — E0500 Thyrotoxicosis with diffuse goiter without thyrotoxic crisis or storm: Secondary | ICD-10-CM

## 2019-02-16 ENCOUNTER — Encounter
Admission: RE | Admit: 2019-02-16 | Discharge: 2019-02-16 | Disposition: A | Discharge status: Home / Self Care | Payer: Medicaid Other | Source: Ambulatory Visit | Attending: Internal Medicine | Admitting: Internal Medicine

## 2019-02-16 ENCOUNTER — Other Ambulatory Visit: Payer: Self-pay

## 2019-02-16 DIAGNOSIS — E0500 Thyrotoxicosis with diffuse goiter without thyrotoxic crisis or storm: Secondary | ICD-10-CM | POA: Insufficient documentation

## 2019-02-16 MED ORDER — SODIUM IODIDE I-123 7.4 MBQ CAPS
472.9610 | ORAL_CAPSULE | Freq: Once | ORAL | Status: AC
  Administered 2019-02-16: 472.961 via ORAL

## 2019-02-17 ENCOUNTER — Encounter
Admission: RE | Admit: 2019-02-17 | Discharge: 2019-02-17 | Disposition: A | Discharge status: Home / Self Care | Payer: Medicaid Other | Source: Ambulatory Visit | Attending: Internal Medicine | Admitting: Internal Medicine

## 2019-02-20 ENCOUNTER — Encounter (HOSPITAL_BASED_OUTPATIENT_CLINIC_OR_DEPARTMENT_OTHER)
Admission: RE | Admit: 2019-02-20 | Discharge: 2019-02-20 | Disposition: A | Discharge status: Home / Self Care | Payer: Medicaid Other | Source: Ambulatory Visit | Attending: Internal Medicine | Admitting: Internal Medicine

## 2019-02-20 ENCOUNTER — Other Ambulatory Visit: Payer: Self-pay

## 2019-02-20 DIAGNOSIS — E0500 Thyrotoxicosis with diffuse goiter without thyrotoxic crisis or storm: Secondary | ICD-10-CM

## 2019-02-20 MED ORDER — SODIUM IODIDE I 131 CAPSULE
11.5850 | Freq: Once | INTRAVENOUS | Status: AC | PRN
  Administered 2019-02-20: 09:00:00 11.585 via ORAL

## 2019-10-17 IMAGING — NM NUCLEAR MEDICINE THYROID UPTAKE (4 AND 24 HOUR) AND SCAN
1 series · 4 of 4 positions shown · non-contrast
Comparison: None

CLINICAL DATA: Graves disease, heat sensitivity

EXAM:
THYROID SCAN AND UPTAKE - 4 AND 24 HOURS
TECHNIQUE: Following oral administration of G-4R8 capsule, anterior planar
imaging was acquired at 24 hours. Thyroid uptake was calculated with
a thyroid probe at 4-6 hours and 24 hours.
RADIOPHARMACEUTICALS:  472.961 uCi G-4R8 sodium iodide p.o.

[Series 1000: (id) thyroid scan · 2.40mm/px · 4 of 4 slices shown]
[im 1/4]
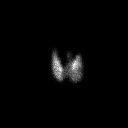
[im 2/4]
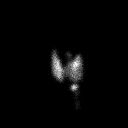
[im 3/4]
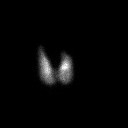
[im 4/4]
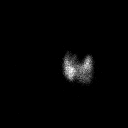

[4 of 4 positions shown; findings below may reference images not displayed]

FINDINGS: Homogeneous increased tracer distribution in both thyroid lobes.

Subtle cold nodule questioned at inferior pole LEFT lobe.

No focal areas of increased tracer localization.

Small pyramidal lobe noted.

4 hour G-4R8 uptake = 63.9% (normal 5-20%)

24 hour G-4R8 uptake = 79.5% (normal 10-30%)
IMPRESSION: Markedly elevated 4 hour and 24 hour radio iodine uptakes consistent
with hyperthyroidism.

Findings consistent with Graves disease.

Additionally, subtle cold nodule questioned at inferior pole LEFT
lobe; recommend thyroid ultrasound to exclude thyroid neoplasm,
prior to consideration of radioactive iodine therapy.

## 2022-10-03 ENCOUNTER — Other Ambulatory Visit: Payer: Self-pay | Admitting: Family Medicine

## 2022-10-03 DIAGNOSIS — Z1231 Encounter for screening mammogram for malignant neoplasm of breast: Secondary | ICD-10-CM
